# Patient Record
Sex: Male | Born: 2005 | Race: White | Hispanic: No | Marital: Single | Smoking: Never smoker
Health system: Southern US, Community
[De-identification: ages and names within clinical notes are randomized; demographics above are authoritative.]

## PROBLEM LIST (undated history)

## (undated) HISTORY — PX: TYMPANOSTOMY TUBE PLACEMENT: SHX32

---
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
## 2023-01-26 ENCOUNTER — Encounter (HOSPITAL_COMMUNITY): Payer: Self-pay

## 2023-01-26 ENCOUNTER — Emergency Department (HOSPITAL_COMMUNITY): Payer: Medicaid Other

## 2023-01-26 ENCOUNTER — Emergency Department (HOSPITAL_COMMUNITY)
Admission: EM | Admit: 2023-01-26 | Discharge: 2023-01-26 | Disposition: A | Discharge status: Home / Self Care | Payer: Medicaid Other | Attending: Emergency Medicine | Admitting: Emergency Medicine

## 2023-01-26 ENCOUNTER — Other Ambulatory Visit: Payer: Self-pay

## 2023-01-26 DIAGNOSIS — W228XXA Striking against or struck by other objects, initial encounter: Secondary | ICD-10-CM | POA: Diagnosis not present

## 2023-01-26 DIAGNOSIS — S0993XA Unspecified injury of face, initial encounter: Secondary | ICD-10-CM | POA: Diagnosis present

## 2023-01-26 DIAGNOSIS — Z91040 Latex allergy status: Secondary | ICD-10-CM | POA: Diagnosis not present

## 2023-01-26 NOTE — Discharge Instructions (Signed)
 Evaluation today was overall reassuring.  Your x-ray was negative for fracture or any acute injury.  Recommend you to follow-up with a dentist as the x-ray did reveal impacted wisdom tooth about the right mandible.  Please follow-up with your dentist.

## 2023-01-26 NOTE — ED Provider Notes (Signed)
 [place] EMERGENCY DEPARTMENT AT [facility] Provider Note   CSN: [id number] Arrival date & time: 01/26/23  1541     History  Chief Complaint  Patient presents with   Headache   HPI Zachary Morse is a 18 y.o. male presenting after hitting himself in the chin.  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  States he had some tenderness there as a result.  Denies any limited mobility in his jaw but is concerned he may have fractured a tooth or the front portion of his jaw.   Headache      Home Medications Prior to Admission medications   Medication Sig Start Date End Date Taking? Authorizing Provider  cephALEXin  (KEFLEX ) 500 MG capsule Take 1 capsule (500 mg total) by mouth 3 (three) times daily. Patient not taking: Reported on 11/23/2021 07/07/19   Knapp, Iva, MD  cetirizine  (ZYRTEC ) 10 MG tablet Take 1 tablet (10 mg total) by mouth daily. Patient not taking: Reported on 11/23/2021 07/01/15   Hicks, Roselyn M, MD  Dexmethylphenidate HCl 30 MG CP24 One pill before breakfast Patient not taking: Reported on 11/23/2021 10/23/17   [provider]  diphenhydrAMINE (BENADRYL ALLERGY) 25 MG tablet Take 25 mg by mouth every 6 (six) hours as needed. Patient not taking: Reported on 11/23/2021    [provider]  ibuprofen (ADVIL,MOTRIN) 200 MG tablet Take 2 tablets by mouth every 6 hours for pain Patient not taking: Reported on 11/23/2021 02/29/16   [provider]  ondansetron  (ZOFRAN  ODT) 4 MG disintegrating tablet Take 1 tablet (4 mg total) by mouth every 8 (eight) hours as needed for nausea or vomiting. Patient not taking: Reported on 11/23/2021 12/20/17   Donah Riis A, PA-C  OVER THE COUNTER MEDICATION Take 1 tablet by mouth as needed. Reported on 07/01/2015 Patient not taking: Reported on 11/23/2021    [provider]  ranitidine  (ZANTAC  75) 75 MG tablet One tablet Once to twice  daily. Patient not taking: Reported on 11/23/2021 07/01/15   Hicks, Roselyn M, MD      Allergies    Dust mite extract, Other, and Latex    Review of Systems   Review of Systems  Neurological:  Positive for headaches.    Physical Exam Updated Vital Signs BP (!) 141/81 (BP Location: Left Arm)   Pulse 73   Temp 98.4 F (36.9 C) (Oral)   Resp 15   Ht 6' 1 (1.854 m)   Wt 97.5 kg   SpO2 100%   BMI 28.37 kg/m  Physical Exam Constitutional:      Appearance: Normal appearance.  HENT:     Head: Normocephalic.     Nose: Nose normal.     Mouth/Throat:     Dentition: Normal dentition. No dental tenderness, gingival swelling, dental caries, dental abscesses or gum lesions.     Tongue: No lesions. Tongue does not deviate from midline.     Comments: Anterior mouth appears atraumatic, no gingival bleeding or swelling noted.  No dental fractures noted.  No tenderness to palpation.  Morse of motion of the jaw appears grossly normal. Eyes:     Conjunctiva/sclera: Conjunctivae normal.  Pulmonary:     Effort: Pulmonary effort is normal.  Neurological:     Mental Status: He is alert.  Psychiatric:        Mood and Affect: Mood normal.     ED Results / Procedures /  Treatments   Labs (all labs ordered are listed, but only abnormal results are displayed) Labs Reviewed - No data to display  EKG None  Radiology DG Orthopantogram Result Date: 01/26/2023 CLINICAL DATA:  Jaw pain. EXAM: ORTHOPANTOGRAM/PANORAMIC COMPARISON:  None Available. FINDINGS: No definite fracture or dislocation. There is an impacted wisdom tooth on the right mandible. No periapical abscesses are noted. No significant dental caries. IMPRESSION: Impacted wisdom tooth on the right mandible. Electronically Signed   By: Norman Hopper M.D.   On: 01/26/2023 17:19    Procedures Procedures    Medications Ordered in ED Medications - No data to display  ED Course/ Medical Decision Making/ A&P                                  Medical Decision Making  18 year old well-appearing male presenting for injury to the chin.  Exam was unremarkable and chin along with anterior mouth appeared atraumatic.  X-ray revealed impacted wisdom tooth on the right mandible but otherwise no acute findings.  Advised him to follow-up with a dentist.  Offered Tylenol  for pain but patient refused.  Overall appears well, no acute distress, hemodynamically stable and nontoxic.  Discharged.         Final Clinical Impression(s) / ED Diagnoses Final diagnoses:  Injury of jaw, initial encounter    Rx / DC Orders ED Discharge Orders     None         Lang Norleen POUR, PA-C 01/26/23 2351    Jerral Meth, MD 01/27/23 1459

## 2023-01-26 NOTE — ED Provider Triage Note (Signed)
 Emergency Medicine Provider Triage Evaluation Note  VERNARD GRAM , a 18 y.o. male  was evaluated in triage.  Pt complains of jaw pain.  Review of Systems  Positive: Headache, jaw pain, abrasion Negative: Laceration, vision changes, hearing changes, blood thinners, broken teeth, malaligned jaw  Physical Exam  BP 123/74 (BP Location: Left Arm)   Pulse 95   Temp 98.3 F (36.8 C)   Resp 16   Ht 6' 1 (1.854 m)   Wt 97.5 kg   SpO2 98%   BMI 28.37 kg/m  Gen:   Awake, no distress   Resp:  Normal effort  MSK:   Moves extremities without difficulty  Other:  Abrasion to chin, minor bleeding to gums  Medical Decision Making  Medically screening exam initiated at 4:21 PM.  Appropriate orders placed.  CRIST KRUSZKA was informed that the remainder of the evaluation will be completed by another provider, this initial triage assessment does not replace that evaluation, and the importance of remaining in the ED until their evaluation is complete.  Imaging placed   Francis Ileana SAILOR, PA-C 01/26/23 1624

## 2023-01-26 NOTE — ED Triage Notes (Signed)
 Pt reports headache after hitting himself in the chin while trying to pull something.

## 2023-02-04 ENCOUNTER — Emergency Department (HOSPITAL_BASED_OUTPATIENT_CLINIC_OR_DEPARTMENT_OTHER)
Admission: EM | Admit: 2023-02-04 | Discharge: 2023-02-04 | Disposition: A | Discharge status: Home / Self Care | Payer: Worker's Compensation | Attending: Emergency Medicine | Admitting: Emergency Medicine

## 2023-02-04 ENCOUNTER — Encounter (HOSPITAL_BASED_OUTPATIENT_CLINIC_OR_DEPARTMENT_OTHER): Payer: Self-pay | Admitting: Emergency Medicine

## 2023-02-04 ENCOUNTER — Other Ambulatory Visit: Payer: Self-pay

## 2023-02-04 DIAGNOSIS — Y990 Civilian activity done for income or pay: Secondary | ICD-10-CM | POA: Diagnosis not present

## 2023-02-04 DIAGNOSIS — S41011A Laceration without foreign body of right shoulder, initial encounter: Secondary | ICD-10-CM | POA: Insufficient documentation

## 2023-02-04 DIAGNOSIS — S4991XA Unspecified injury of right shoulder and upper arm, initial encounter: Secondary | ICD-10-CM | POA: Diagnosis present

## 2023-02-04 DIAGNOSIS — W25XXXA Contact with sharp glass, initial encounter: Secondary | ICD-10-CM | POA: Insufficient documentation

## 2023-02-04 DIAGNOSIS — Z23 Encounter for immunization: Secondary | ICD-10-CM | POA: Diagnosis not present

## 2023-02-04 DIAGNOSIS — Z91040 Latex allergy status: Secondary | ICD-10-CM | POA: Insufficient documentation

## 2023-02-04 LAB — RAPID URINE DRUG SCREEN, HOSP PERFORMED
Amphetamines: NOT DETECTED
Barbiturates: NOT DETECTED
Benzodiazepines: NOT DETECTED
Cocaine: NOT DETECTED
Opiates: NOT DETECTED
Tetrahydrocannabinol: NOT DETECTED

## 2023-02-04 MED ORDER — TETANUS-DIPHTH-ACELL PERTUSSIS 5-2.5-18.5 LF-MCG/0.5 IM SUSY
0.5000 mL | PREFILLED_SYRINGE | Freq: Once | INTRAMUSCULAR | Status: AC
  Administered 2023-02-04: 0.5 mL via INTRAMUSCULAR
  Filled 2023-02-04: qty 0.5

## 2023-02-04 MED ORDER — OXYCODONE-ACETAMINOPHEN 5-325 MG PO TABS: 1.0000 | ORAL_TABLET | Freq: Three times a day (TID) | ORAL | 0 refills | Status: AC | PRN

## 2023-02-04 MED ORDER — LIDOCAINE-EPINEPHRINE (PF) 2 %-1:200000 IJ SOLN
20.0000 mL | Freq: Once | INTRAMUSCULAR | Status: AC
  Administered 2023-02-04: 20 mL
  Filled 2023-02-04: qty 20

## 2023-02-04 NOTE — Discharge Instructions (Addendum)
 Sutures are to be removed in 7 days - that can be done at your PCP's office, urgent care, or ED. Leave the dressing on your laceration for the next 24 hours. After that, you can leave the wound open to air. After the first 24 hours, you can begin to clean the wound site with soap and water to prevent crusting over the suture knots. Apply a thin layer of Bacitracin to the wound. Do not use hydrogen peroxide as it delays wound healing.   Do not go into pools, lakes, or oceans until the sutures are removed in order to prevent infection.   Alternate between Ibuprofen and Tylenol  every 4 hours as needed for pain. I have sent a prescription of Percocet to your pharmacy to take as needed for breakthrough pain.  Get help right away if: You have very bad swelling around the wound. Your pain suddenly gets worse and is very bad. You have painful lumps near the wound or on skin anywhere on your body. You have a red streak going away from your wound. The wound is on your hand or foot, and: You cannot move a finger or toe. Your fingers or toes look pale or bluish.

## 2023-02-04 NOTE — ED Provider Notes (Signed)
 [place] EMERGENCY DEPARTMENT AT [facility] Provider Note   CSN: [id number] Arrival date & time: 02/04/23  9078     History  Chief Complaint  Patient presents with   Extremity Laceration    Zachary Morse is a 18 y.o. male with no significant past medical history presents the ED today for a shoulder injury.  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  He has 2 small lacerations and one abrasion to the top of the shoulder.  Bleeding is controlled.  He denies any weakness, loss of sensation, numbness or tingling to the right arm.  No impairment to the range of motion of the right shoulder.  He does not know when his last tetanus vaccine was. No additional complaints or concerns at this time.    Home Medications Prior to Admission medications   Medication Sig Start Date End Date Taking? Authorizing Provider  oxyCODONE -acetaminophen  (PERCOCET/ROXICET) 5-325 MG tablet Take 1 tablet by mouth every 8 (eight) hours as needed for up to 1 day for severe pain (pain score 7-10). 02/04/23 02/05/23 Yes Waddell Sluder, PA-C  cephALEXin  (KEFLEX ) 500 MG capsule Take 1 capsule (500 mg total) by mouth 3 (three) times daily. Patient not taking: Reported on 11/23/2021 07/07/19   Knapp, Iva, MD  cetirizine  (ZYRTEC ) 10 MG tablet Take 1 tablet (10 mg total) by mouth daily. Patient not taking: Reported on 11/23/2021 07/01/15   Hicks, Roselyn M, MD  Dexmethylphenidate HCl 30 MG CP24 One pill before breakfast Patient not taking: Reported on 11/23/2021 10/23/17   [provider]  diphenhydrAMINE (BENADRYL ALLERGY) 25 MG tablet Take 25 mg by mouth every 6 (six) hours as needed. Patient not taking: Reported on 11/23/2021    [provider]  ibuprofen (ADVIL,MOTRIN) 200 MG tablet Take 2 tablets by mouth every 6 hours for pain Patient not taking: Reported on 11/23/2021 02/29/16   [provider]  ondansetron  (ZOFRAN  ODT) 4 MG disintegrating tablet  Take 1 tablet (4 mg total) by mouth every 8 (eight) hours as needed for nausea or vomiting. Patient not taking: Reported on 11/23/2021 12/20/17   Donah Riis A, PA-C  OVER THE COUNTER MEDICATION Take 1 tablet by mouth as needed. Reported on 07/01/2015 Patient not taking: Reported on 11/23/2021    [provider]  ranitidine  (ZANTAC  75) 75 MG tablet One tablet Once to twice daily. Patient not taking: Reported on 11/23/2021 07/01/15   Hicks, Roselyn M, MD      Allergies    Dust mite extract, Other, and Latex    Review of Systems   Review of Systems  Skin:  Positive for wound.  All other systems reviewed and are negative.   Physical Exam Updated Vital Signs BP 121/76   Pulse 76   Temp 99.4 F (37.4 C) (Oral)   Resp 16   Wt 98 kg   SpO2 100%   BMI 28.50 kg/m  Physical Exam Vitals and nursing note reviewed.  Constitutional:      General: He is not in acute distress.    Appearance: Normal appearance.  HENT:     Head: Normocephalic and atraumatic.     Mouth/Throat:     Mouth: Mucous membranes are moist.  Eyes:     Conjunctiva/sclera: Conjunctivae normal.     Pupils: Pupils are equal, round, and reactive to light.  Cardiovascular:     Rate and Rhythm: Normal rate and regular rhythm.     Pulses: Normal  pulses.  Pulmonary:     Effort: Pulmonary effort is normal.     Breath sounds: Normal breath sounds.  Abdominal:     Palpations: Abdomen is soft.     Tenderness: There is no abdominal tenderness.  Musculoskeletal:        General: No swelling or tenderness. Normal range of motion.     Cervical back: Normal range of motion. No tenderness.     Comments: Range of motion, sensation, and strength of right upper extremity remains intact.  Skin:    General: Skin is warm and dry.     Findings: No rash.     Comments: 2 small lacerations to the top of the right shoulder and 1 abrasion  Neurological:     General: No focal deficit present.     Mental Status: He is alert.   Psychiatric:        Mood and Affect: Mood normal.        Behavior: Behavior normal.    ED Results / Procedures / Treatments   Labs (all labs ordered are listed, but only abnormal results are displayed) Labs Reviewed  RAPID URINE DRUG SCREEN, HOSP PERFORMED    EKG None  Radiology No results found.  Procedures .Laceration Repair  Date/Time: 02/04/2023 11:19 AM  Performed by: Waddell Sluder, PA-C Authorized by: Waddell Sluder, PA-C   Consent:    Consent obtained:  Verbal   Consent given by:  Patient   Risks discussed:  Pain and retained foreign body   Alternatives discussed:  No treatment and delayed treatment Anesthesia:    Anesthesia method:  Local infiltration   Local anesthetic:  Lidocaine  1% WITH epi Laceration details:    Location:  Shoulder/arm   Shoulder/arm location:  R shoulder   Length (cm):  3 Exploration:    Hemostasis achieved with:  Epinephrine  Treatment:    Area cleansed with:  Saline   Amount of cleaning:  Extensive   Irrigation solution:  Sterile saline   Irrigation method:  Syringe and pressure wash Skin repair:    Repair method:  Sutures   Suture size:  6-0   Suture material:  Prolene   Suture technique:  Simple interrupted   Number of sutures:  4 Approximation:    Approximation:  Loose Repair type:    Repair type:  Simple Post-procedure details:    Dressing:  Antibiotic ointment and non-adherent dressing   Procedure completion:  Tolerated well, no immediate complications     Medications Ordered in ED Medications  Tdap (BOOSTRIX ) injection 0.5 mL (0.5 mLs Intramuscular Given 02/04/23 1019)  lidocaine -EPINEPHrine  (XYLOCAINE  W/EPI) 2 %-1:200000 (PF) injection 20 mL (20 mLs Infiltration Given by Other 02/04/23 1019)    ED Course/ Medical Decision Making/ A&P                                 Medical Decision Making Amount and/or Complexity of Data Reviewed Labs: ordered.  Risk Prescription drug management.   This patient presents  to the ED for concern of right shoulder injury, this involves an extensive number of treatment options, and is a complaint that carries with it a high risk of complications and morbidity.   Differential diagnosis includes: fracture, dislocation, contusion, laceration, abrasion, etc. Low clinical suspicion for fracture or dislocation - no deformity, tenderness to palpation, impaired ROM.   Comorbidities  No significant past medical history   Additional History  Additional history obtained from prior  records.   Lab Tests  I ordered and personally interpreted labs.  The pertinent results include:   Negative UDS   Problem List / ED Course / Critical Interventions / Medication Management  2 small lacerations to the top of the right shoulder after cutting cut by glass. There is no glass identified after extensive irrigation. Discussed with patient and mother at bedside that an x-ray would not show any retained foreign bodies. If there is glass present, it will come out on its own I ordered medications including: Tdap booster for tetanus update Lidocaine  for analgesics for laceration repair  Reevaluation of the patient after these medicines showed that the patient improved. I have reviewed the patients home medicines and have made adjustments as needed Bacitracin applied after suture repair and packets were given for patient to use at home. Wound was dressed prior to discharge. Wound care information provided.   Social Determinants of Health  Occupation    Test / Admission - Considered  Patient is stable and safe for discharge home. Return precautions given.       Final Clinical Impression(s) / ED Diagnoses Final diagnoses:  Laceration of right shoulder, initial encounter    Rx / DC Orders ED Discharge Orders          Ordered    oxyCODONE -acetaminophen  (PERCOCET/ROXICET) 5-325 MG tablet  Every 8 hours PRN        02/04/23 1105              Waddell Sluder,  PA-C 02/04/23 1208    Emil Share, DO 02/04/23 1227

## 2023-02-04 NOTE — ED Triage Notes (Addendum)
 Pt reports getting "glass shard" in RT shoulder when pushing a vehicle, pushed against a door with a glass pane, and glass fell onto pt. Bleeding controlled, saline bandage applied. 3 lacs noted to RT shoulder. Unk. Last tetanus

## 2023-02-04 NOTE — ED Notes (Signed)
 Reviewed AVS/discharge instruction with patient. Time allotted for and all questions answered. Patient is agreeable for d/c and escorted to ed exit by staff.

## 2023-04-24 ENCOUNTER — Emergency Department (HOSPITAL_BASED_OUTPATIENT_CLINIC_OR_DEPARTMENT_OTHER)
Admission: EM | Admit: 2023-04-24 | Discharge: 2023-04-24 | Disposition: A | Discharge status: Home / Self Care | Payer: Self-pay | Attending: Emergency Medicine | Admitting: Emergency Medicine

## 2023-04-24 ENCOUNTER — Other Ambulatory Visit: Payer: Self-pay

## 2023-04-24 ENCOUNTER — Emergency Department (HOSPITAL_BASED_OUTPATIENT_CLINIC_OR_DEPARTMENT_OTHER): Payer: Self-pay | Admitting: Radiology

## 2023-04-24 DIAGNOSIS — J069 Acute upper respiratory infection, unspecified: Secondary | ICD-10-CM | POA: Insufficient documentation

## 2023-04-24 LAB — RESP PANEL BY RT-PCR (RSV, FLU A&B, COVID)  RVPGX2
Influenza A by PCR: NEGATIVE
Influenza B by PCR: NEGATIVE
Resp Syncytial Virus by PCR: NEGATIVE
SARS Coronavirus 2 by RT PCR: NEGATIVE

## 2023-04-24 LAB — GROUP A STREP BY PCR: Group A Strep by PCR: NOT DETECTED

## 2023-04-24 NOTE — ED Triage Notes (Signed)
 Pt c/o of sore throat, pain while coughing and nasal congestion that started past weekend. Recently been around family who has pneumonia.

## 2023-04-24 NOTE — ED Provider Notes (Signed)
 [name] EMERGENCY DEPARTMENT AT [facility] Provider Note   CSN: [id number] Arrival date & time: 04/24/23  4098     History Chief Complaint  Patient presents with   Sore Throat   Cough    Zachary Morse is a 18 y.o. male.  Patient without significant medical history presents to the emergency department today with concerns of sore throat and cough.  Reports that he has been around family members who was recent diagnosed with pneumonia.  He is endorsing some associated nasal congestion but denies any recent fever chills or bodyaches.  Denies any significant productive coughing but has endorsed some nasal discharge with clearing of the nasal passages.  Currently taking Mucinex with some improvement in symptoms.  Denies abdominal pain, nausea, vomiting, or diarrhea.   Sore Throat  Cough      Home Medications Prior to Admission medications   Not on File      Allergies    Dust mite extract, Other, and Latex    Review of Systems   Review of Systems  Respiratory:  Positive for cough.   All other systems reviewed and are negative.   Physical Exam Updated Vital Signs BP (!) 143/78 (BP Location: Right Arm)   Pulse 93   Temp 98.2 F (36.8 C) (Oral)   Resp 16   Ht 6\' 1"  (1.854 m)   Wt 95.3 kg   SpO2 100%   BMI 27.71 kg/m  Physical Exam Vitals and nursing note reviewed.  Constitutional:      General: He is not in acute distress.    Appearance: He is well-developed.  HENT:     Head: Normocephalic and atraumatic.  Eyes:     Conjunctiva/sclera: Conjunctivae normal.  Cardiovascular:     Rate and Rhythm: Normal rate and regular rhythm.     Heart sounds: No murmur heard. Pulmonary:     Effort: Pulmonary effort is normal. No respiratory distress.     Breath sounds: Normal breath sounds. No wheezing or rales.  Abdominal:     Palpations: Abdomen is soft.     Tenderness: There is no abdominal tenderness.  Musculoskeletal:        General: No swelling.      Cervical back: Neck supple.  Skin:    General: Skin is warm and dry.     Capillary Refill: Capillary refill takes less than 2 seconds.  Neurological:     Mental Status: He is alert.  Psychiatric:        Mood and Affect: Mood normal.     ED Results / Procedures / Treatments   Labs (all labs ordered are listed, but only abnormal results are displayed) Labs Reviewed  RESP PANEL BY RT-PCR (RSV, FLU A&B, COVID)  [id number]  GROUP A STREP BY PCR    EKG None  Radiology DG Chest 2 View Result Date: 04/24/2023 CLINICAL DATA:  17 year old male with pain and cough. Sore throat, congestion. EXAM: CHEST - 2 VIEW COMPARISON:  Chest radiograph 07/13/2011. FINDINGS: Lung volumes and mediastinal contours remain normal. Visualized tracheal air column is within normal limits. Both lungs appear clear. No pneumothorax or pleural effusion. Negative visible bowel gas and osseous structures. IMPRESSION: Negative.  No cardiopulmonary abnormality. Electronically Signed   By: Odessa Fleming M.D.   On: 04/24/2023 11:40    Procedures Procedures    Medications Ordered in ED Medications - No data to display  ED Course/ Medical Decision Making/ A&P  Medical Decision Making Amount and/or Complexity of Data Reviewed Radiology: ordered.   This patient presents to the ED for concern of sore throat, cough.  Differential diagnosis includes COVID-19, orchitis, pneumonia, strep pharyngitis   Lab Tests:  I Ordered, and personally interpreted labs.  The pertinent results include: Respiratory panel negative, group A strep negative   Imaging Studies ordered:  I ordered imaging studies including chest x-ray I independently visualized and interpreted imaging which showed no acute findings I agree with the radiologist interpretation   Problem List / ED Course:  Patient with no significant medical history presents to the emergency department today with concerns of sore throat and a  cough.  He reports it has been ongoing for the last several days and is concerned for possible exposure to the member who is positive for pneumonia.  He denies any significant productive coughing.  Denies any low-grade fever, chills or bodyaches. Physical exam is reassuring with no abnormal finding seen.  No abnormal lung sounds such as wheezing, rales, or rhonchi.  Doubtful of pneumonia at this time but will obtain chest x-ray and respiratory swab as well as strep swab for assessment of symptoms. Patient's respiratory panel is negative.  Group A strep negative.  Chest x-ray pending at this time. Respiratory panel negative.  Group A strep negative.  Chest x-ray negative. Informed patient of reassuring workup and advised that symptoms are likely more consistent with a viral upper respiratory infection with possible association with seasonal allergies.  Encouraged continued use of Mucinex for congestion but advised using an antihistamine if he notes significant throat itching or eye irritation.  Patient otherwise stable for outpatient follow-up at this time discharged home in stable condition.  Final Clinical Impression(s) / ED Diagnoses Final diagnoses:  Viral URI with cough    Rx / DC Orders ED Discharge Orders     None         Smitty Knudsen, PA-C 04/24/23 1159    Elayne Snare K, DO 04/24/23 1204

## 2023-04-24 NOTE — Discharge Instructions (Signed)
 You are seen in the emergency department today for concerns of a sore throat, cough.  You were negative for COVID, influenza, RSV, strep, chest x-ray is negative for any signs of pneumonia.  I would recommend continuing to use your Mucinex for viral type symptoms.  You can add an antihistamine such as Zyrtec for your symptoms if you feel that you are having more itchy throat and itchy eyes.  For any concerns of new or worsening symptoms, return to the emergency department.
